# Patient Record
Sex: Female | Born: 1990 | Race: Black or African American | Hispanic: No | Marital: Single | Smoking: Never smoker
Health system: Southern US, Community
[De-identification: ages and names within clinical notes are randomized; demographics above are authoritative.]

## PROBLEM LIST (undated history)

## (undated) ENCOUNTER — Ambulatory Visit (HOSPITAL_COMMUNITY): Payer: Self-pay

## (undated) DIAGNOSIS — L509 Urticaria, unspecified: Secondary | ICD-10-CM

## (undated) HISTORY — DX: Urticaria, unspecified: L50.9

---
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
## 2018-12-30 ENCOUNTER — Emergency Department (HOSPITAL_COMMUNITY)
Admission: EM | Admit: 2018-12-30 | Discharge: 2018-12-31 | Disposition: A | Discharge status: Home / Self Care | Payer: Self-pay | Attending: Emergency Medicine | Admitting: Emergency Medicine

## 2018-12-30 ENCOUNTER — Other Ambulatory Visit: Payer: Self-pay

## 2018-12-30 ENCOUNTER — Encounter (HOSPITAL_COMMUNITY): Payer: Self-pay | Admitting: Emergency Medicine

## 2018-12-30 DIAGNOSIS — R51 Headache: Secondary | ICD-10-CM | POA: Insufficient documentation

## 2018-12-30 DIAGNOSIS — I10 Essential (primary) hypertension: Secondary | ICD-10-CM | POA: Insufficient documentation

## 2018-12-30 LAB — CBC
HCT: 43.3 % (ref 36.0–46.0)
Hemoglobin: 14.6 g/dL (ref 12.0–15.0)
MCH: 30.2 pg (ref 26.0–34.0)
MCHC: 33.7 g/dL (ref 30.0–36.0)
MCV: 89.6 fL (ref 80.0–100.0)
Platelets: 332 10*3/uL (ref 150–400)
RBC: 4.83 MIL/uL (ref 3.87–5.11)
RDW: 11.8 % (ref 11.5–15.5)
WBC: 7.3 10*3/uL (ref 4.0–10.5)
nRBC: 0 % (ref 0.0–0.2)

## 2018-12-30 LAB — BASIC METABOLIC PANEL
Anion gap: 13 (ref 5–15)
BUN: 6 mg/dL (ref 6–20)
CO2: 24 mmol/L (ref 22–32)
Calcium: 10.1 mg/dL (ref 8.9–10.3)
Chloride: 99 mmol/L (ref 98–111)
Creatinine, Ser: 0.82 mg/dL (ref 0.44–1.00)
GFR calc Af Amer: >60 mL/min (ref >60)
GFR calc non Af Amer: >60 mL/min (ref >60)
Glucose, Bld: 116 mg/dL — ABNORMAL HIGH (ref 70–99)
Potassium: 3.9 mmol/L (ref 3.5–5.1)
Sodium: 136 mmol/L (ref 135–145)

## 2018-12-30 LAB — I-STAT BETA HCG BLOOD, ED (MC, WL, AP ONLY): I-stat hCG, quantitative: <5 m[IU]/mL (ref <5)

## 2018-12-30 MED ORDER — SODIUM CHLORIDE 0.9% FLUSH
3.0000 mL | Freq: Once | INTRAVENOUS | Status: AC
  Administered 2018-12-31: 3 mL via INTRAVENOUS

## 2018-12-30 NOTE — ED Provider Notes (Signed)
[facility] EMERGENCY DEPARTMENT Provider Note   CSN: [id number] Arrival date & time: 12/30/18  2007    History   Chief Complaint Chief Complaint  Patient presents with   Hypertension    HPI Kylie Larsen is a 28 y.o. female.     HPI   Pt is a 28 y/o female who presents to the ED today c/o dizziness that began 4 days ago. States sxs have been intermittent since onset. Sxs are only present when she walks. Had vertiginous sxs several days ago thath ave resolved. Denies lightheadedness or near syncope. States she feels somewhat off balance.   States she had a headache 4 days ago that improved with motrin. Pain was gradual in onset and was rated 6 at the worse. Pain was located to the left side of her head. States she has a h/o intermittent HA's mostly when she is on her menses and she is currently menstruating now.   No vision changes, numbness, weakness, nausea, vomiting. No head trauma. No CP or SOB. No BLE edema. No fevers or recent URI sxs. Denies drug or ETOH use.  She was seen at urgent care [facility] and was sent here for further eval as she was noted to have HTN.   History reviewed. No pertinent past medical history.  There are no active problems to display for this patient.   History reviewed. No pertinent surgical history.   OB History   No obstetric history on file.      Home Medications    Prior to Admission medications   Medication Sig Start Date End Date Taking? Authorizing Provider  amLODipine (NORVASC) 5 MG tablet Take 1 tablet (5 mg total) by mouth daily for 30 days. 12/31/18 01/30/19  Omer Puccinelli S, PA-C    Family History No family history on file.  Social History Social History   Tobacco Use   Smoking status: Never Smoker   Smokeless tobacco: Never Used  Substance Use Topics   Alcohol use: Yes   Drug use: Not Currently     Allergies   Patient has no known allergies.   Review of Systems Review of Systems    Constitutional: Negative for chills and fever.  HENT: Negative for ear pain and sore throat.   Eyes: Negative for visual disturbance.  Respiratory: Negative for cough and shortness of breath.   Cardiovascular: Negative for chest pain and palpitations.  Gastrointestinal: Negative for abdominal pain, nausea and vomiting.  Genitourinary: Negative for dysuria and hematuria.  Musculoskeletal: Negative for back pain.  Skin: Negative for rash.  Neurological: Positive for dizziness and headaches (resolved). Negative for syncope, speech difficulty, weakness, light-headedness and numbness.  All other systems reviewed and are negative.   Physical Exam Updated Vital Signs BP (!) 137/107    Pulse (!) 111    Temp 98.3 F (36.8 C) (Oral)    Resp (!) 25    Ht  (1.727 m)    Wt 94.3 kg    LMP 12/27/2018    SpO2 100%    BMI 31.63 kg/m   Physical Exam Vitals signs and nursing note reviewed.  Constitutional:      General: She is not in acute distress.    Appearance: She is well-developed. She is not ill-appearing or toxic-appearing.  HENT:     Head: Normocephalic and atraumatic.  Eyes:     Extraocular Movements: Extraocular movements intact.     Conjunctiva/sclera: Conjunctivae normal.     Pupils: Pupils are equal, round,  and reactive to light.     Comments: No nystagmus  Neck:     Musculoskeletal: Neck supple.  Cardiovascular:     Rate and Rhythm: Normal rate and regular rhythm.     Heart sounds: No murmur.  Pulmonary:     Effort: Pulmonary effort is normal. No respiratory distress.     Breath sounds: Normal breath sounds.  Abdominal:     Palpations: Abdomen is soft.     Tenderness: There is no abdominal tenderness.  Skin:    General: Skin is warm and dry.  Neurological:     Mental Status: She is alert.     Comments: Mental Status:  Alert, thought content appropriate, able to give a coherent history. Speech fluent without evidence of aphasia. Able to follow 2 step commands without  difficulty.  Cranial Nerves:  II: pupils equal, round, reactive to light III,IV, VI: ptosis not present, extra-ocular motions intact bilaterally  V,VII: smile symmetric, facial light touch sensation equal VIII: hearing grossly normal to voice  X: uvula elevates symmetrically  XI: bilateral shoulder shrug symmetric and strong XII: midline tongue extension without fassiculations Motor:  Normal tone. 5/5 strength of BUE and BLE major muscle groups including strong and equal grip strength and dorsiflexion/plantar flexion Sensory: light touch normal in all extremities. Cerebellar: normal finger-to-nose with bilateral upper extremities, normal heel to shin Gait: normal gait and balance. Able to walk on toes and heels with ease.  CV: 2+ radial and DP pulses Negative romberg, negative pronator drift      ED Treatments / Results  Labs (all labs ordered are listed, but only abnormal results are displayed) Labs Reviewed  BASIC METABOLIC PANEL - Abnormal; Notable for the following components:      Result Value   Glucose, Bld 116 (*)    All other components within normal limits  URINALYSIS, ROUTINE W REFLEX MICROSCOPIC - Abnormal; Notable for the following components:   Color, Urine STRAW (*)    Hgb urine dipstick MODERATE (*)    Bacteria, UA RARE (*)    All other components within normal limits  CBC  RAPID URINE DRUG SCREEN, HOSP PERFORMED  I-STAT BETA HCG BLOOD, ED ([facility], [facility], [facility] ONLY)    EKG EKG Interpretation  Date/Time:  Friday Dec 30 2018 23:48:21 EDT Ventricular Rate:  101 PR Interval:    QRS Duration: 93 QT Interval:  331 QTC Calculation: 429 R Axis:   30 Text Interpretation:  Sinus tachycardia No old tracing to compare Confirmed by Ward, Baxter Hire [phone]) on 12/31/2018 12:19:00 AM   Radiology Ct Head Wo Contrast  Result Date: 12/31/2018 CLINICAL DATA:  28 y/o F; dizziness, imbalance when walking, high blood pressure. EXAM: CT HEAD WITHOUT CONTRAST TECHNIQUE: Contiguous axial  images were obtained from the base of the skull through the vertex without intravenous contrast. COMPARISON:  None. FINDINGS: Brain: No evidence of acute infarction, hemorrhage, hydrocephalus, extra-axial collection or mass lesion/mass effect. Cavum vergae. Linear density in the left globus pallidus, typical location for dystrophic calcifications. Partially empty sella turcica. Vascular: No hyperdense vessel or unexpected calcification. Skull: Normal. Negative for fracture or focal lesion. Sinuses/Orbits: No acute finding. Other: None. IMPRESSION: Linear density in the left globus pallidus is typical for dystrophic calcifications, while age advanced, this is unlikely to represent hemorrhage. Partially empty sella turcica. Otherwise unremarkable CT of the head. Electronically Signed   By: Mitzi Hansen M.D.   On: 12/31/2018 00:45    Procedures Procedures (including critical care time)  Medications Ordered  in ED Medications  sodium chloride flush (NS) 0.9 % injection 3 mL (3 mLs Intravenous Given 12/31/18 0205)  acetaminophen (TYLENOL) tablet 650 mg (650 mg Oral Given 12/31/18 0204)  amLODipine (NORVASC) tablet 5 mg (5 mg Oral Given 12/31/18 0204)     Initial Impression / Assessment and Plan / ED Course  I have reviewed the triage vital signs and the nursing notes.  Pertinent labs & imaging results that were available during my care of the patient were reviewed by me and considered in my medical decision making (see chart for details).     Final Clinical Impressions(s) / ED Diagnoses   Final diagnoses:  Hypertension, unspecified type   Pt presenting to the ED for eval of HTN and dizziness. Sxs present x4 days. Occur when standing. Improve at rest. [facility] fur further eval due to HTN. Denies h/o this, has family hx. No CP, SOB, or other associated neuro complaints.   Initially HTN with BP 170s systolic. Improved to 148/113 without intervention. Borderline tachycardic,  states she is anxious to be here and this happens whenever she is at the hospital. Otherwise VS normal.   Exam reassuring. Neuro exam is nonfocal and she has no ataxia. Negative romberg testing. No nystagmus.   CBC WNL BMP normal electrolytes. Normal kidney function.  Beta hcg negative UA with moderate hematuria and rare bacteria.  Asymptomatic. UDS is negative   EKG with sinus tachycardia, HR 101. No acute ischemic changes or arrhythmia.  CT head Linear density in the left globus pallidus is typical for dystrophic calcifications, while age advanced, this is unlikely to represent hemorrhage. Partially empty sella turcica. Otherwise unremarkable CT of the head.  On reevaluation after Tylenol and amlodipine, patient resting comfortably in bed in no acute distress.  Heart rate 80s on monitor.  Her blood pressure has improved significantly after a dose of amlodipine.  She was ambulated again and denies any continued symptoms of dizziness with ambulation.  Discussed results of work-up and plan for discharge on amlodipine.  Advised PCP follow-up for reevaluation.  Case management and social work consult placed to ensure PCP follow-up.  Patient given resources.  Advised to monitor blood pressures at home.  Advised return the ER for new or worsening symptoms.  She voiced understanding of plan and reasons return.  All questions answered.  Patient is stable for discharge.  Case discussed with Dr. Elesa MassedWard who is in agreement with plan  ED Discharge Orders         Ordered    amLODipine (NORVASC) 5 MG tablet  Daily     12/31/18 0455           Karrie MeresCouture, Keslyn Teater S, PA-C 12/31/18 0709    Ward, Layla MawKristen N, DO 12/31/18 [phone]

## 2018-12-30 NOTE — ED Triage Notes (Addendum)
[facility] for [facility], no hx of same. Pt went to [facility] d/t intermittent dizziness with standing and walking. Steady gait,denies fever, denies n/v, denies HA

## 2018-12-31 ENCOUNTER — Emergency Department (HOSPITAL_COMMUNITY): Payer: Self-pay

## 2018-12-31 LAB — URINALYSIS, ROUTINE W REFLEX MICROSCOPIC
Bilirubin Urine: NEGATIVE
Glucose, UA: NEGATIVE mg/dL
Ketones, ur: NEGATIVE mg/dL
Leukocytes,Ua: NEGATIVE
Nitrite: NEGATIVE
Protein, ur: NEGATIVE mg/dL
Specific Gravity, Urine: 1.005 (ref 1.005–1.030)
pH: 6 (ref 5.0–8.0)

## 2018-12-31 LAB — RAPID URINE DRUG SCREEN, HOSP PERFORMED
Amphetamines: NOT DETECTED
Barbiturates: NOT DETECTED
Benzodiazepines: NOT DETECTED
Cocaine: NOT DETECTED
Opiates: NOT DETECTED
Tetrahydrocannabinol: NOT DETECTED

## 2018-12-31 MED ORDER — AMLODIPINE BESYLATE 5 MG PO TABS: 5.0000 mg | ORAL_TABLET | Freq: Every day | ORAL | 0 refills | Status: DC

## 2018-12-31 MED ORDER — AMLODIPINE BESYLATE 5 MG PO TABS
5.0000 mg | ORAL_TABLET | Freq: Once | ORAL | Status: AC
  Administered 2018-12-31: 5 mg via ORAL
  Filled 2018-12-31: qty 1

## 2018-12-31 MED ORDER — ACETAMINOPHEN 325 MG PO TABS
650.0000 mg | ORAL_TABLET | Freq: Once | ORAL | Status: AC
  Administered 2018-12-31: 650 mg via ORAL
  Filled 2018-12-31: qty 2

## 2018-12-31 NOTE — Discharge Instructions (Addendum)
Steps to find a Primary Care Provider (PCP):  Call [phone] or [phone] to access "[facility] Find a Doctor Service."  2.  You may also go on the [facility] website at [email or web address]  3.  [facility] also frequently accepts new patients.  [facility]  [address] [place] [address] [phone]  4.  There are also multiple [facility], [facility] and [facility] practices throughout [place] that are frequently accepting new patients. You may find a clinic that is close to your home and contact them.  [facility] [email or web address] [phone]  [email or web address]  [facility] [email or web address] [phone]  [facility] [email or web address] [phone]  5.  Local Health Departments also can provide primary care services.  [facility]  [address] [place] [address] [phone]  [facility] Department [address] [place] [address] [phone]  [facility] Department [address] [place] [address]  [place] [address] [phone]    -------------------------------------------------------------------------  Please monitor your blood pressures at home until you are able to follow-up with primary care.  Please follow up with your primary care provider within 5-7 days for re-evaluation of your symptoms. If you do not have a primary care provider, information for a healthcare clinic has been provided for you to make arrangements for follow up care. Please return to the emergency department for any new or worsening symptoms.

## 2018-12-31 NOTE — ED Notes (Signed)
Gone to CT

## 2020-12-22 IMAGING — CT CT HEAD WITHOUT CONTRAST
4 series · 17 of 47 positions shown, 19 images · non-contrast
Comparison: None.

CLINICAL DATA: 28 y/o F; dizziness, imbalance when walking, high
blood pressure.

EXAM:
CT HEAD WITHOUT CONTRAST
TECHNIQUE: Contiguous axial images were obtained from the base of the skull
through the vertex without intravenous contrast.

[Series 3: head bone · axial · 0.44mm/px · z∈[-140,-84]mm · 4 of 82 slices shown]
[im 9/82  bone]
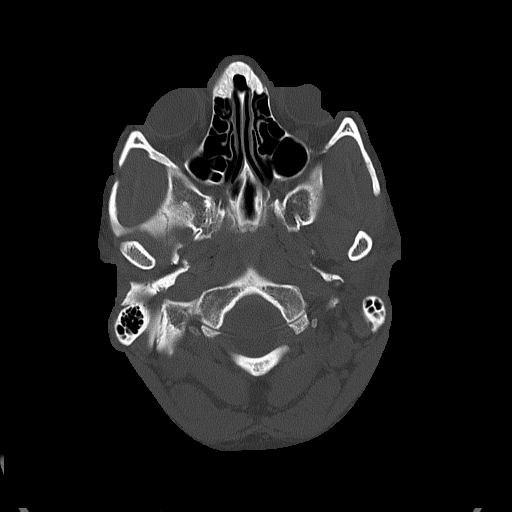
[im 17/82  bone]
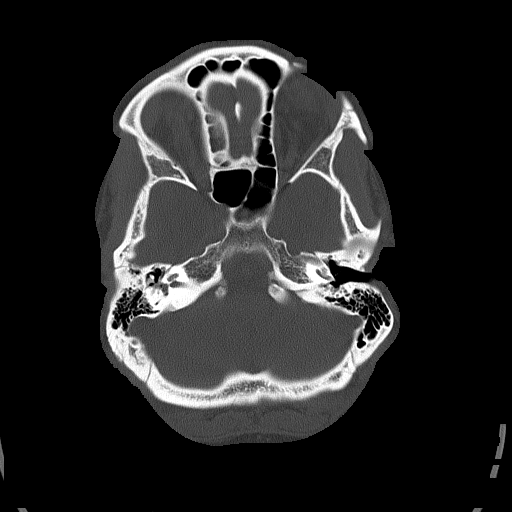
[im 25/82  bone]
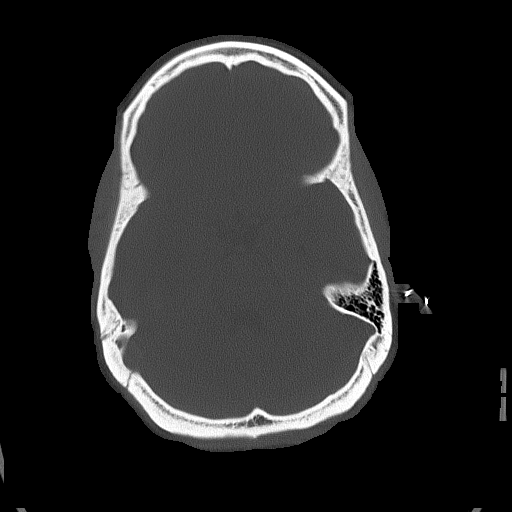
[im 37/82  bone]
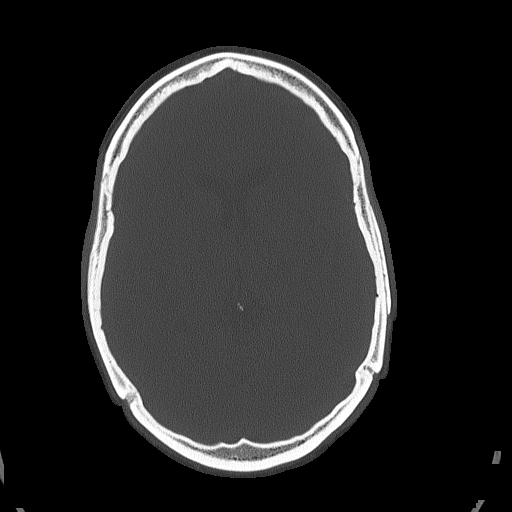

[Series 4: head without · axial · non-contrast · 0.44mm/px · z∈[-136,-16]mm · 7 of 33 slices shown, 9 images]
[im 5/33  brain]
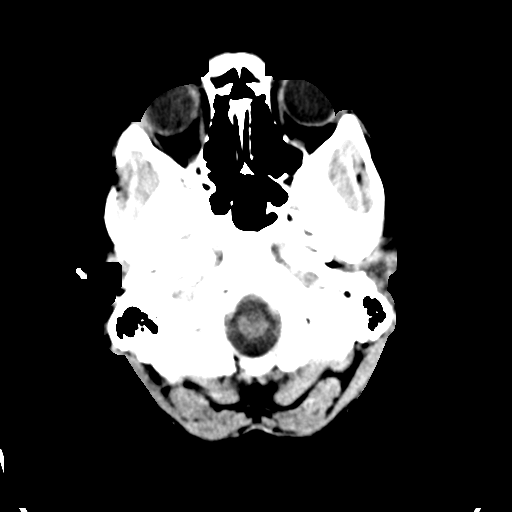
[im 5/33  bone]
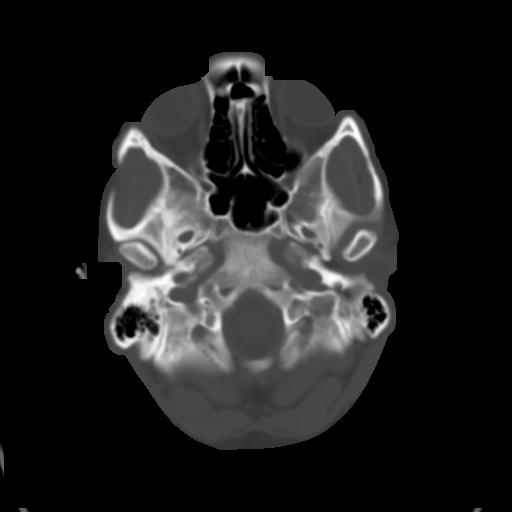
[im 9/33  brain]
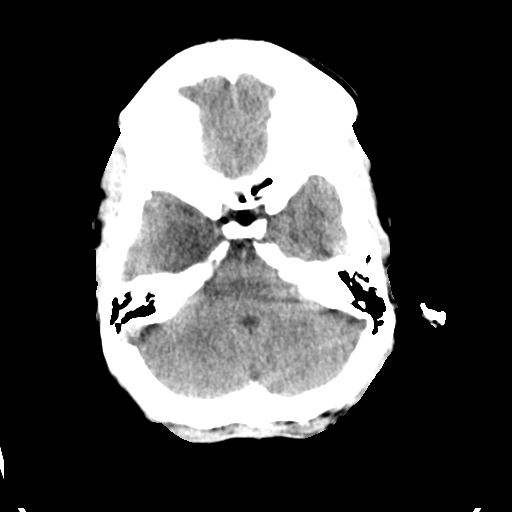
[im 13/33  brain]
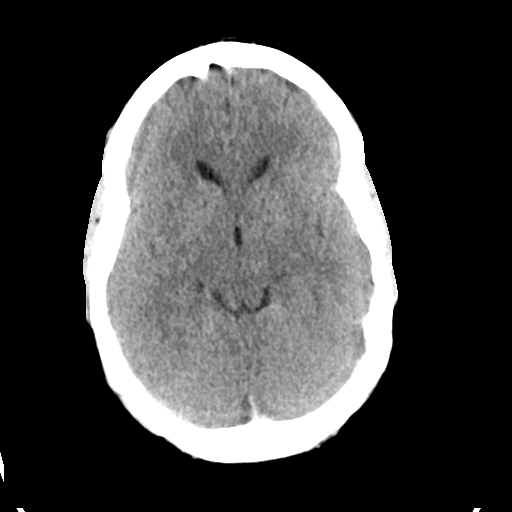
[im 17/33  brain]
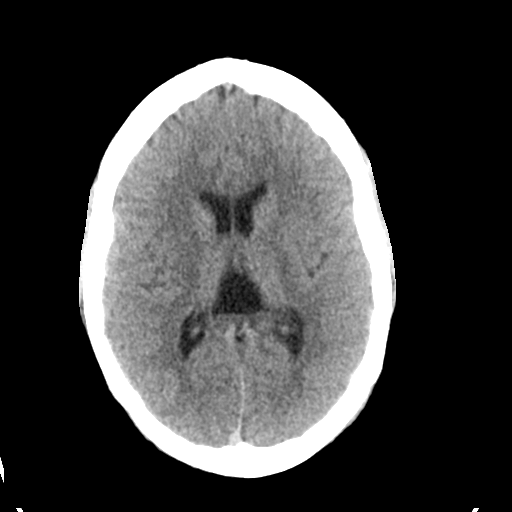
[im 21/33  brain]
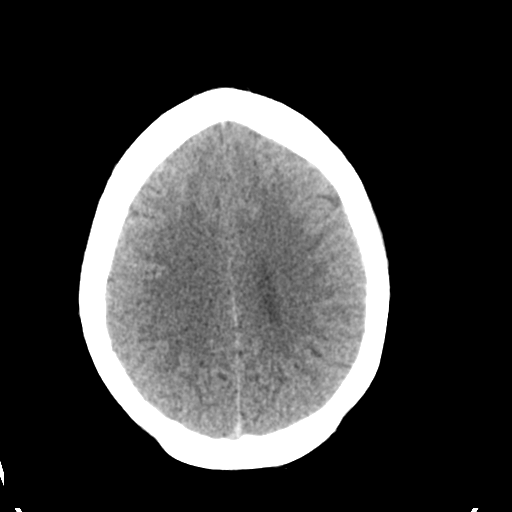
[im 21/33  bone]
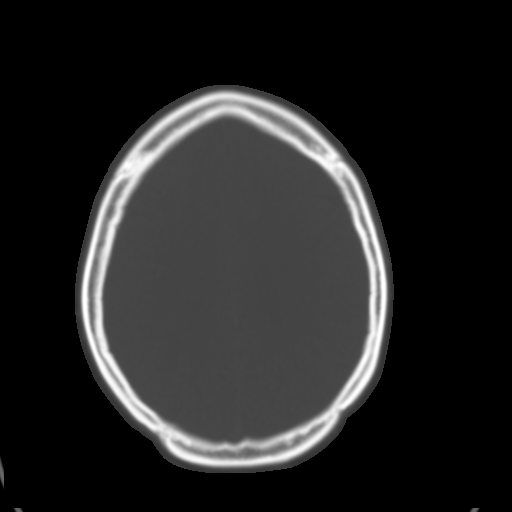
[im 25/33  brain]
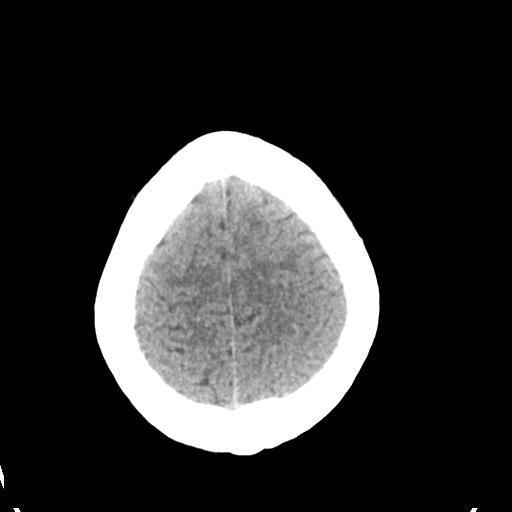
[im 29/33  brain]
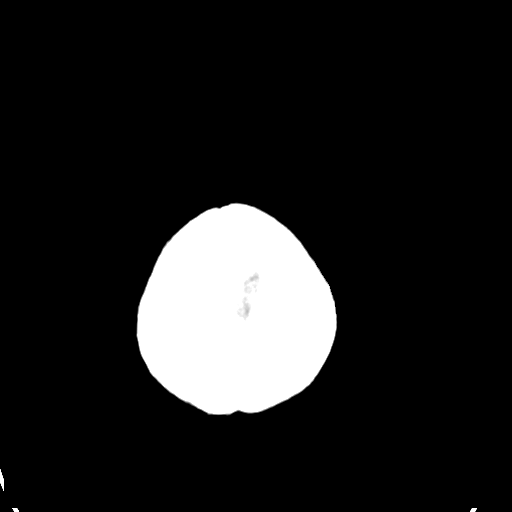

[Series 5: head without cor · coronal · non-contrast · 0.32mm/px · 3 of 68 slices shown]
[im 23/68  brain]
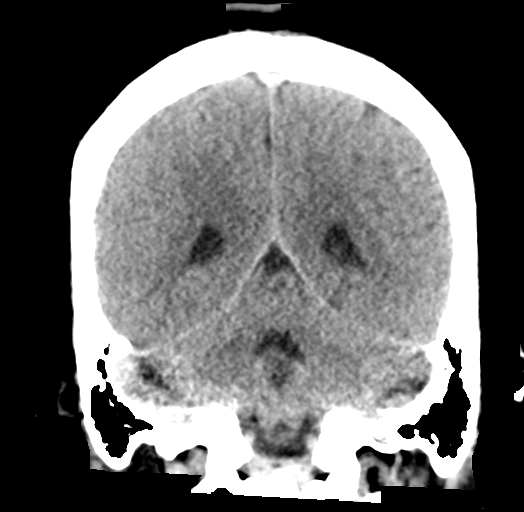
[im 30/68  brain]
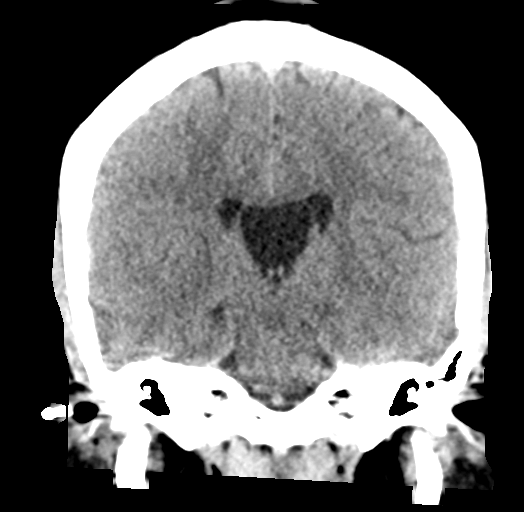
[im 38/68  brain]
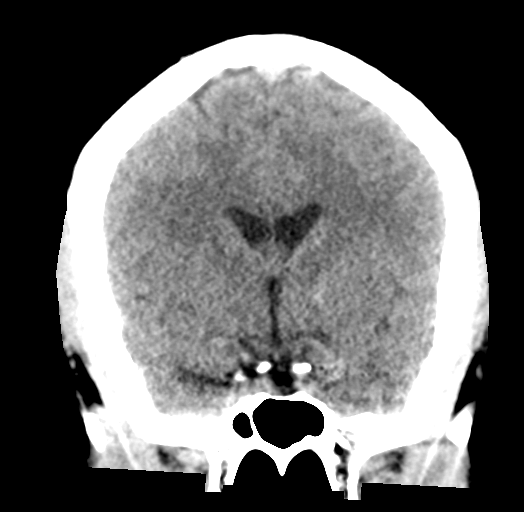

[Series 6: head without sag · sagittal · non-contrast · 0.33mm/px · 3 of 57 slices shown]
[im 19/57  brain]
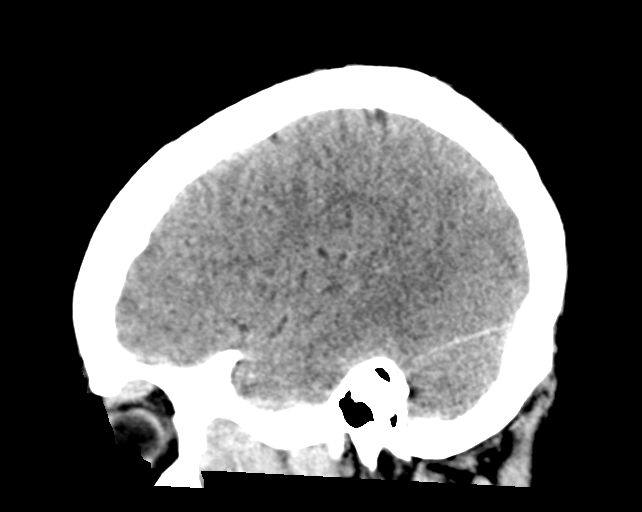
[im 29/57  brain]
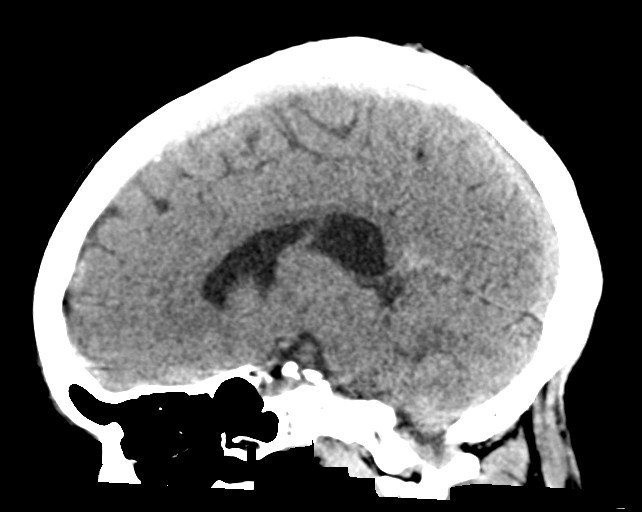
[im 38/57  brain]
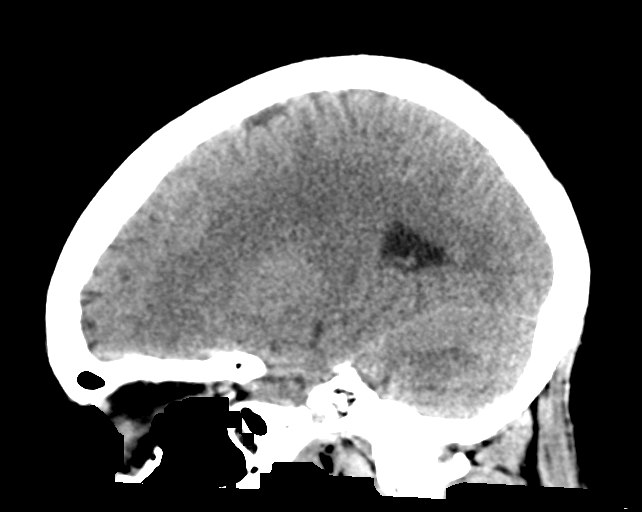

[17 of 47 positions shown; findings below may reference images not displayed]

FINDINGS: Brain: No evidence of acute infarction, hemorrhage, hydrocephalus,
extra-axial collection or mass lesion/mass effect. Cavum vergae.
Linear density in the left globus pallidus, typical location for
dystrophic calcifications. Partially empty sella turcica.

Vascular: No hyperdense vessel or unexpected calcification.

Skull: Normal. Negative for fracture or focal lesion.

Sinuses/Orbits: No acute finding.

Other: None.
IMPRESSION: Linear density in the left globus pallidus is typical for dystrophic
calcifications, while age advanced, this is unlikely to represent
hemorrhage. Partially empty sella turcica. Otherwise unremarkable CT
of the head.

## 2023-01-20 ENCOUNTER — Encounter (HOSPITAL_COMMUNITY): Payer: Self-pay

## 2023-01-20 ENCOUNTER — Ambulatory Visit (HOSPITAL_COMMUNITY)
Admission: EM | Admit: 2023-01-20 | Discharge: 2023-01-20 | Disposition: A | Discharge status: Home / Self Care | Payer: Self-pay | Attending: Emergency Medicine | Admitting: Emergency Medicine

## 2023-01-20 VITALS — BP 170/133 | HR 97 | Temp 98.5°F | Resp 17

## 2023-01-20 DIAGNOSIS — S161XXA Strain of muscle, fascia and tendon at neck level, initial encounter: Secondary | ICD-10-CM

## 2023-01-20 DIAGNOSIS — M25562 Pain in left knee: Secondary | ICD-10-CM

## 2023-01-20 DIAGNOSIS — M25522 Pain in left elbow: Secondary | ICD-10-CM

## 2023-01-20 MED ORDER — NAPROXEN 500 MG PO TABS: 500.0000 mg | ORAL_TABLET | Freq: Two times a day (BID) | ORAL | 0 refills | Status: AC

## 2023-01-20 MED ORDER — METHOCARBAMOL 500 MG PO TABS: 500.0000 mg | ORAL_TABLET | Freq: Two times a day (BID) | ORAL | 0 refills | Status: AC
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
## 2023-08-04 ENCOUNTER — Encounter: Payer: Self-pay | Admitting: Allergy

## 2023-08-04 ENCOUNTER — Other Ambulatory Visit: Payer: Self-pay

## 2023-08-04 ENCOUNTER — Ambulatory Visit: Payer: Medicaid Other | Admitting: Allergy

## 2023-08-04 VITALS — BP 160/120 | HR 100 | Temp 98.6°F | Resp 16 | Ht 68.9 in | Wt 215.1 lb

## 2023-08-04 DIAGNOSIS — H1013 Acute atopic conjunctivitis, bilateral: Secondary | ICD-10-CM

## 2023-08-04 DIAGNOSIS — J3089 Other allergic rhinitis: Secondary | ICD-10-CM

## 2023-08-04 DIAGNOSIS — J302 Other seasonal allergic rhinitis: Secondary | ICD-10-CM | POA: Diagnosis not present

## 2023-08-04 MED ORDER — LEVOCETIRIZINE DIHYDROCHLORIDE 5 MG PO TABS: 5.0000 mg | ORAL_TABLET | Freq: Every evening | ORAL | 5 refills | Status: AC

## 2023-08-04 MED ORDER — CROMOLYN SODIUM 4 % OP SOLN: OPHTHALMIC | 5 refills | Status: AC

## 2023-08-04 MED ORDER — AZELASTINE-FLUTICASONE 137-50 MCG/ACT NA SUSP: NASAL | 5 refills | Status: AC

## 2023-08-04 NOTE — Patient Instructions (Signed)
-   Testing today showed: grasses, weeds, trees, indoor molds, outdoor molds, dust mites, cat, and dog - Copy of test results provided.  - Avoidance measures provided. - Start taking:  Xyzal (levocetirizine) 5mg  tablet once daily as needed for general allergy symptom control. Dymista (fluticasone/azelastine) 1 spray per nostril 1-2 times daily as needed for runny or stuffy nose.  Cromolyn 1-2 drop per eye up to 4 times daily as needed for itchy/watery eyes.  - You can use an extra dose of the antihistamine, if needed, for breakthrough symptoms.  - Continue nasal saline rinses 3-7 times a week to remove allergens from the nasal cavities as well as help with mucous clearance (this is especially helpful to do before the nasal sprays are given) - Consider allergy shots as a means of long-term control. - Allergy shots "re-train" and "reset" the immune system to ignore environmental allergens and decrease the resulting immune response to those allergens (sneezing, itchy watery eyes, runny nose, nasal congestion, etc).    - Allergy shots improve symptoms in 75-85% of patients.  - We can discuss more at future appointment if the medications are not working for you.  Follow-up in 4-6 months or sooner if needed

## 2023-08-04 NOTE — Progress Notes (Signed)
New Patient Note  RE: Kylie Larsen MRN: [id number] DOB: 01/24/1991 Date of Office Visit: 08/04/2023  Primary care provider: Patient, No Pcp Per  Chief Complaint: allergies  History of present illness: Kylie Larsen is a 32 y.o. female presenting today for evaluation of allergic rhinitis.    Discussed the use of AI scribe software for clinical note transcription with the patient, who gave verbal consent to proceed. The patient also reports associated symptoms of itchy, watery eyes and frequent throat clearing.  The patient presents with a longstanding history of sinus-related symptoms, including facial pain, congestion, sneezing, and coughing. These symptoms have been present for several years but have worsened over the past few months. The patient reports that the symptoms were initially seasonal, particularly during the spring months, but have now become a year-round issue. The patient has attempted to manage these symptoms with over-the-counter medications such as Allegra and home remedies like tea, with only partial relief. The patient has recently started using a saline rinse for symptom management too. In addition to these symptoms, the patient has a history of recurrent sinus infections, requiring antibiotic treatment approximately three times a year. The patient describes these episodes as feeling more like a cold, with increased fatigue and aches. The patient also reports a recent episode of facial pain extending to the jaw, which was evaluated at an urgent care center. The pain has since resolved.  She states she was told likely an TMJ issue.    The patient denies any history of asthma or significant food allergies but reports a childhood reaction to a change in laundry detergent.     Review of systems: 10pt ROS negative unless noted above in HPI  All other systems negative unless noted above in HPI  Past medical history: Past Medical History:  Diagnosis Date    Urticaria     Past surgical history: History reviewed. No pertinent surgical history.  Family history:  Family History  Problem Relation Age of Onset   Asthma Mother    Allergic rhinitis Mother     Social history: Lives in a townhome with carpeting in the bedroom with electric heating and central cooling.  Dogs in the home.  No concern for roaches in the home.  There is concern for water damage, mildew in the home.  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  Denies a smoking history.   Medication List: Current Outpatient Medications  Medication Sig Dispense Refill   methocarbamol (ROBAXIN) 500 MG tablet Take 1 tablet (500 mg total) by mouth 2 (two) times daily. (Patient not taking: Reported on 08/04/2023) 20 tablet 0   naproxen (NAPROSYN) 500 MG tablet Take 1 tablet (500 mg total) by mouth 2 (two) times daily. (Patient not taking: Reported on 08/04/2023) 30 tablet 0   No current facility-administered medications for this visit.    Known medication allergies: No Known Allergies   Physical examination: Blood pressure (!) 160/120, pulse 100, temperature 98.6 F (37 C), temperature source Temporal, resp. rate 16, height 5' 8.9" (1.75 m), weight 215 lb 1.6 oz (97.6 kg), SpO2 98%.  General: Alert, interactive, in no acute distress. HEENT: PERRLA, TMs pearly gray, turbinates minimally edematous without discharge, post-pharynx non erythematous. Neck: Supple without lymphadenopathy. Lungs: Clear to auscultation without wheezing, rhonchi or rales. {no increased work of breathing. CV: Normal S1, S2 without murmurs. Abdomen: Nondistended, nontender. Skin: Warm and dry, without lesions or rashes. Extremities:  No clubbing, cyanosis or edema. Neuro:   Grossly  intact.  Diagnositics/Labs:  Allergy testing:   Airborne Adult Perc - 08/04/23 0916     Time Antigen Placed 1610    Allergen Manufacturer Waynette Buttery    Location Back    Number of Test 55    1. Control-Buffer 50%  Glycerol Negative    2. Control-Histamine 2+    3. Bahia Negative    4. [place] Negative    5. Johnson Negative    6. Kentucky Blue 4+    7. Meadow Fescue Negative    8. Perennial Rye 2+    9. Timothy Negative    10. Ragweed Mix Negative    11. Cocklebur Negative    12. Plantain,  English Negative    13. Baccharis Negative    14. Dog Fennel Negative    15. Russian Thistle Negative    16. Lamb's Quarters Negative    17. Sheep Sorrell Negative    18. Rough Pigweed Negative    19. Marsh Elder, Rough Negative    20. Mugwort, Common Negative    21. Box, Elder Negative    22. Cedar, red Negative    23. Sweet Gum 3+    24. Pecan Pollen Negative    25. Pine Mix Negative    26. Walnut, Black Pollen Negative    27. Red Mulberry Negative    28. Ash Mix Negative    29. Birch Mix 4+    30. Beech American 4+    31. Cottonwood, [place] Negative    32. Hickory, White 2+    33. Maple Mix 2+    34. Oak, [place] Mix 4+    35. Sycamore Eastern Negative    36. Alternaria Alternata Negative    37. Cladosporium Herbarum Negative    38. Aspergillus Mix Negative    39. Penicillium Mix Negative    40. Bipolaris Sorokiniana (Helminthosporium) Negative    41. Drechslera Spicifera (Curvularia) Negative    42. Mucor Plumbeus Negative    43. Fusarium Moniliforme Negative    44. Aureobasidium Pullulans (pullulara) Negative    45. Rhizopus Oryzae Negative    46. Botrytis Cinera Negative    47. Epicoccum Nigrum Negative    48. Phoma Betae Negative    49. Dust Mite Mix 2+    50. Cat Hair 10,000 BAU/ml 3+    51.  Dog Epithelia Negative    52. Mixed Feathers Negative    53. Horse Epithelia Negative    54. Cockroach, German Negative    55. Tobacco Leaf Negative             Intradermal - 08/04/23 1006     Time Antigen Placed 1006    Allergen Manufacturer Waynette Buttery    Location Arm    Number of Test 9    Control Negative    Ragweed Mix Negative    Weed Mix 2+    Mold 1 4+    Mold 2 2+     Mold 3 2+    Mold 4 3+    Dog 3+    Cockroach Negative             Allergy testing results were read and interpreted by provider, documented by clinical staff.   Assessment and plan: Allergic rhinitis with conjunctivitis - Testing today showed: grasses, weeds, trees, indoor molds, outdoor molds, dust mites, cat, and dog - Copy of test results provided.  - Avoidance measures provided. - Start taking:  Xyzal (levocetirizine) 5mg  tablet once daily as needed for general  allergy symptom control. Dymista (fluticasone/azelastine) 1 spray per nostril 1-2 times daily as needed for runny or stuffy nose.  Cromolyn 1-2 drop per eye up to 4 times daily as needed for itchy/watery eyes.  - You can use an extra dose of the antihistamine, if needed, for breakthrough symptoms.  - Continue nasal saline rinses 3-7 times a week to remove allergens from the nasal cavities as well as help with mucous clearance (this is especially helpful to do before the nasal sprays are given) - Consider allergy shots as a means of long-term control. - Allergy shots "re-train" and "reset" the immune system to ignore environmental allergens and decrease the resulting immune response to those allergens (sneezing, itchy watery eyes, runny nose, nasal congestion, etc).    - Allergy shots improve symptoms in 75-85% of patients.  - We can discuss more at future appointment if the medications are not working for you.  Follow-up in 4 to 6 months or sooner if needed I appreciate the opportunity to take part in [place] care. Please do not hesitate to contact me with questions.  Sincerely,   Margo Aye, MD Allergy/Immunology [facility]
